# Patient Record
Sex: Male | Born: 1965 | Race: White | Hispanic: No | Marital: Married | Smoking: Former smoker
Health system: Southern US, Community
[De-identification: ages and names within clinical notes are randomized; demographics above are authoritative.]

## PROBLEM LIST (undated history)

## (undated) DIAGNOSIS — K219 Gastro-esophageal reflux disease without esophagitis: Secondary | ICD-10-CM

## (undated) HISTORY — PX: TONSILLECTOMY: SUR1361

---

## 2018-07-14 ENCOUNTER — Emergency Department (HOSPITAL_COMMUNITY)
Admission: EM | Admit: 2018-07-14 | Discharge: 2018-07-14 | Disposition: A | Discharge status: Home / Self Care | Payer: Self-pay | Attending: Emergency Medicine | Admitting: Emergency Medicine

## 2018-07-14 ENCOUNTER — Emergency Department (HOSPITAL_COMMUNITY): Payer: Self-pay

## 2018-07-14 ENCOUNTER — Encounter (HOSPITAL_COMMUNITY): Payer: Self-pay

## 2018-07-14 DIAGNOSIS — R1012 Left upper quadrant pain: Secondary | ICD-10-CM | POA: Insufficient documentation

## 2018-07-14 DIAGNOSIS — Y9289 Other specified places as the place of occurrence of the external cause: Secondary | ICD-10-CM | POA: Insufficient documentation

## 2018-07-14 DIAGNOSIS — S20212A Contusion of left front wall of thorax, initial encounter: Secondary | ICD-10-CM | POA: Insufficient documentation

## 2018-07-14 DIAGNOSIS — Y990 Civilian activity done for income or pay: Secondary | ICD-10-CM | POA: Insufficient documentation

## 2018-07-14 DIAGNOSIS — Z87891 Personal history of nicotine dependence: Secondary | ICD-10-CM | POA: Insufficient documentation

## 2018-07-14 DIAGNOSIS — S0990XA Unspecified injury of head, initial encounter: Secondary | ICD-10-CM | POA: Insufficient documentation

## 2018-07-14 DIAGNOSIS — Y9389 Activity, other specified: Secondary | ICD-10-CM | POA: Insufficient documentation

## 2018-07-14 DIAGNOSIS — W132XXA Fall from, out of or through roof, initial encounter: Secondary | ICD-10-CM | POA: Insufficient documentation

## 2018-07-14 LAB — CBC WITH DIFFERENTIAL/PLATELET
Abs Immature Granulocytes: 0.07 10*3/uL (ref 0.00–0.07)
Basophils Absolute: 0.1 10*3/uL (ref 0.0–0.1)
Basophils Relative: 1 %
Eosinophils Absolute: 0.1 10*3/uL (ref 0.0–0.5)
Eosinophils Relative: 0 %
HCT: 47.5 % (ref 39.0–52.0)
Hemoglobin: 15.2 g/dL (ref 13.0–17.0)
Immature Granulocytes: 1 %
Lymphocytes Relative: 15 %
Lymphs Abs: 1.8 10*3/uL (ref 0.7–4.0)
MCH: 30 pg (ref 26.0–34.0)
MCHC: 32 g/dL (ref 30.0–36.0)
MCV: 93.9 fL (ref 80.0–100.0)
Monocytes Absolute: 0.8 10*3/uL (ref 0.1–1.0)
Monocytes Relative: 7 %
NEUTROS PCT: 76 %
Neutro Abs: 9 10*3/uL — ABNORMAL HIGH (ref 1.7–7.7)
Platelets: 234 10*3/uL (ref 150–400)
RBC: 5.06 MIL/uL (ref 4.22–5.81)
RDW: 11.9 % (ref 11.5–15.5)
WBC: 11.8 10*3/uL — ABNORMAL HIGH (ref 4.0–10.5)
nRBC: 0 % (ref 0.0–0.2)

## 2018-07-14 LAB — BASIC METABOLIC PANEL
ANION GAP: 10 (ref 5–15)
BUN: 15 mg/dL (ref 6–20)
CO2: 24 mmol/L (ref 22–32)
Calcium: 9.1 mg/dL (ref 8.9–10.3)
Chloride: 106 mmol/L (ref 98–111)
Creatinine, Ser: 1.03 mg/dL (ref 0.61–1.24)
GFR calc Af Amer: >60 mL/min (ref >60)
GFR calc non Af Amer: >60 mL/min (ref >60)
Glucose, Bld: 95 mg/dL (ref 70–99)
Potassium: 4.5 mmol/L (ref 3.5–5.1)
Sodium: 140 mmol/L (ref 135–145)

## 2018-07-14 MED ORDER — SODIUM CHLORIDE 0.9 % IV BOLUS
1000.0000 mL | Freq: Once | INTRAVENOUS | Status: AC
  Administered 2018-07-14: 1000 mL via INTRAVENOUS

## 2018-07-14 MED ORDER — ONDANSETRON HCL 4 MG/2ML IJ SOLN
4.0000 mg | Freq: Once | INTRAMUSCULAR | Status: AC
  Administered 2018-07-14: 4 mg via INTRAVENOUS
  Filled 2018-07-14: qty 2

## 2018-07-14 MED ORDER — ORPHENADRINE CITRATE ER 100 MG PO TB12: 100.0000 mg | ORAL_TABLET | Freq: Two times a day (BID) | ORAL | 0 refills | Status: AC

## 2018-07-14 MED ORDER — MELOXICAM 7.5 MG PO TABS: 7.5000 mg | ORAL_TABLET | Freq: Every day | ORAL | 0 refills | Status: AC

## 2018-07-14 MED ORDER — IOHEXOL 300 MG/ML  SOLN
100.0000 mL | Freq: Once | INTRAMUSCULAR | Status: AC | PRN
  Administered 2018-07-14: 100 mL via INTRAVENOUS

## 2018-07-14 MED ORDER — HYDROMORPHONE HCL 1 MG/ML IJ SOLN
1.0000 mg | Freq: Once | INTRAMUSCULAR | Status: AC
  Administered 2018-07-14: 1 mg via INTRAVENOUS
  Filled 2018-07-14: qty 1

## 2018-07-14 NOTE — ED Provider Notes (Signed)
[facility] EMERGENCY DEPARTMENT Provider Note   CSN: [id number] Arrival date & time: 07/14/18  1614     History   Chief Complaint Chief Complaint  Patient presents with  . Fall    HPI Benjamin Wilson is a 53 y.o. male.  53 year old male presents with injuries from a fall.  Patient states that he was up on a metal roof today blowing leaves out of a gutter when he fell off of the roof, states he grabbed onto the gutter before falling to the ground landing in the dirt on his left side.  Patient states he had left side of his head with pain in his left shoulder and left ribs.  Patient states that he was helped back up after his fall and then felt like he saw bright white lights before he passed out.  Patient states witnesses on scene states that he was out for a few minutes before he came to and began to feel very nauseous but did not vomit.  Patient refused to have 911 called, drove his work truck home and then came to the emergency room to be seen.  Patient believes he dislocated his shoulder and that is now back into place, reports ongoing left-sided head tenderness, left side neck tenderness, left shoulder pain and rib pain and nausea.  Patient is ambulatory without assistance, no lower extremity injuries, no wounds or bleeding.  No other injuries, complaints, concerns.     History reviewed. No pertinent past medical history.  There are no active problems to display for this patient.   History reviewed. No pertinent surgical history.      Home Medications    Prior to Admission medications   Medication Sig Start Date End Date Taking? Authorizing Provider  meloxicam (MOBIC) 7.5 MG tablet Take 1 tablet (7.5 mg total) by mouth daily for 10 days. 07/14/18 07/24/18  Jeannie Fend, PA-C  orphenadrine (NORFLEX) 100 MG tablet Take 1 tablet (100 mg total) by mouth 2 (two) times daily for 7 days. 07/14/18 07/21/18  Jeannie Fend, PA-C    Family History History  reviewed. No pertinent family history.  Social History Social History   Tobacco Use  . Smoking status: Former Smoker    Last attempt to quit: 07/14/1993    Years since quitting: 25.0  Substance Use Topics  . Alcohol use: Never    Frequency: Never  . Drug use: Never     Allergies   Patient has no allergy information on record.   Review of Systems Review of Systems  Constitutional: Negative for fever.  Eyes: Negative for visual disturbance.  Respiratory: Positive for shortness of breath.   Cardiovascular: Positive for chest pain.  Gastrointestinal: Positive for abdominal pain and nausea. Negative for vomiting.  Genitourinary: Negative for hematuria.  Musculoskeletal: Positive for arthralgias, myalgias and neck pain. Negative for back pain, gait problem and joint swelling.  Skin: Negative for color change, rash and wound.  Allergic/Immunologic: Negative for immunocompromised state.  Neurological: Positive for syncope and headaches.  Hematological: Does not bruise/bleed easily.  Psychiatric/Behavioral: Negative for confusion.  All other systems reviewed and are negative.    Physical Exam Updated Vital Signs BP 129/78   Pulse 73   Temp 98.8 F (37.1 C) (Oral)   Resp 18   Ht 5\' 9"  (1.753 m)   Wt 95.3 kg   SpO2 97%   BMI 31.01 kg/m   Physical Exam Vitals signs and nursing note reviewed.  Constitutional:  General: He is not in acute distress.    Appearance: He is well-developed. He is not diaphoretic.  HENT:     Head: Normocephalic and atraumatic.     Nose: Nose normal.     Mouth/Throat:     Mouth: Mucous membranes are moist.  Eyes:     Extraocular Movements: Extraocular movements intact.     Pupils: Pupils are equal, round, and reactive to light.  Neck:     Musculoskeletal: Muscular tenderness present.  Cardiovascular:     Rate and Rhythm: Normal rate and regular rhythm.     Pulses: Normal pulses.     Heart sounds: Normal heart sounds.  Pulmonary:      Effort: Pulmonary effort is normal.     Breath sounds: Normal breath sounds.  Chest:     Chest wall: Tenderness present.    Abdominal:     Tenderness: There is abdominal tenderness in the left upper quadrant.  Musculoskeletal:        General: Tenderness present. No swelling or deformity.     Left shoulder: He exhibits decreased range of motion and tenderness. He exhibits no swelling, no effusion, no crepitus and normal pulse.     Cervical back: He exhibits no bony tenderness.     Thoracic back: He exhibits no bony tenderness.     Lumbar back: He exhibits no bony tenderness.       Back:  Skin:    General: Skin is warm and dry.     Findings: No abrasion, ecchymosis, erythema, rash or wound.  Neurological:     Mental Status: He is alert and oriented to person, place, and time.  Psychiatric:        Behavior: Behavior normal.      ED Treatments / Results  Labs (all labs ordered are listed, but only abnormal results are displayed) Labs Reviewed  CBC WITH DIFFERENTIAL/PLATELET - Abnormal; Notable for the following components:      Result Value   WBC 11.8 (*)    Neutro Abs 9.0 (*)    All other components within normal limits  BASIC METABOLIC PANEL  URINALYSIS, ROUTINE W REFLEX MICROSCOPIC    EKG None  Radiology Dg Chest 2 View  Result Date: 07/14/2018 CLINICAL DATA:  [id number] year old male status post slip and fall from the top of the house. Felt shoulder dislocate and relocate as he landed. Pain. EXAM: CHEST - 2 VIEW COMPARISON:  Left shoulder series today. FINDINGS: Normal lung volumes and mediastinal contours. Visualized tracheal air column is within normal limits. Both lungs appear clear. No pneumothorax or pleural effusion. Negative visible bowel gas pattern. No acute osseous abnormality identified. IMPRESSION: No acute cardiopulmonary abnormality or acute traumatic injury identified. Electronically Signed   By: Odessa FlemingH  Hall M.D.   On: 07/14/2018 18:18   Ct Head Wo  Contrast  Result Date: 07/14/2018 CLINICAL DATA:  Brain MRI and head CT 08/21/2003. Pain. EXAM: CT HEAD WITHOUT CONTRAST CT CERVICAL SPINE WITHOUT CONTRAST TECHNIQUE: Multidetector CT imaging of the head and cervical spine was performed following the standard protocol without intravenous contrast. Multiplanar CT image reconstructions of the cervical spine were also generated. COMPARISON:  None. FINDINGS: CT HEAD FINDINGS Brain: No midline shift, ventriculomegaly, mass effect, evidence of mass lesion, intracranial hemorrhage or evidence of cortically based acute infarction. Gray-white matter differentiation is within normal limits throughout the brain. Cerebral volume remains normal. Vascular: Mild Calcified atherosclerosis at the skull base. No suspicious intracranial vascular hyperdensity. Skull: Stable small benign appearing right frontal  bone soft tissue lesion on series 5, image 34, benign. No skull fracture. Sinuses/Orbits: Visualized paranasal sinuses and mastoids are stable and well pneumatized. Other: Negative orbits. Visualized scalp soft tissues are within normal limits. CT CERVICAL SPINE FINDINGS Alignment: Mild straightening of cervical lordosis. Cervicothoracic junction alignment is within normal limits. Bilateral posterior element alignment is within normal limits. Skull base and vertebrae: Visualized skull base is intact. No atlanto-occipital dissociation. No cervical spine fracture. Degenerative posterior nuchal calcification overlying the C6 spinous process. Soft tissues and spinal canal: No prevertebral fluid or swelling. No visible canal hematoma. Negative noncontrast neck soft tissues. Disc levels:  Cervical spine degeneration is mild for age. Upper chest: Visible upper thoracic levels appear intact. Negative lung apices. IMPRESSION: 1. No acute traumatic injury identified in the head or cervical spine. 2. Normal noncontrast CT appearance of the brain. Electronically Signed   By: Odessa Fleming M.D.    On: 07/14/2018 18:24   Ct Chest W Contrast  Result Date: 07/14/2018 CLINICAL DATA:  53 year old who fell from a ladder earlier today onto his LEFT side, complaining of progressively worsening LEFT-sided chest pain and abdominal pain. Initial encounter. EXAM: CT CHEST, ABDOMEN, AND PELVIS WITH CONTRAST TECHNIQUE: Multidetector CT imaging of the chest, abdomen and pelvis was performed following the standard protocol during bolus administration of intravenous contrast. CONTRAST:  OMNIPAQUE IOHEXOL 300 MG/ML IV. COMPARISON:  No prior chest CT. CT abdomen and pelvis 01/09/2015. FINDINGS: CT CHEST FINDINGS Cardiovascular: Normal heart size. No visible coronary atherosclerosis. No pericardial effusion. No evidence of thoracic aortic atherosclerosis or aneurysm. Proximal great vessels widely patent. No evidence of vascular injury. Mediastinum/Nodes: Residual thymic tissue in the anterior superior mediastinum. No evidence of mediastinal hematoma. No pathologically enlarged mediastinal, hilar or axillary lymph nodes. No mediastinal masses. Normal-appearing esophagus. Normal-appearing thyroid gland. Lungs/Pleura: Lung parenchyma clear. No evidence of pulmonary contusion. No confluent airspace consolidation. No evidence of interstitial lung disease. No pulmonary parenchymal nodules or masses. No pneumothorax. No pleural effusion/hemothorax. Central airways patent without significant bronchial wall thickening. Musculoskeletal: Regional skeleton unremarkable without acute or significant osseous abnormality. CT ABDOMEN PELVIS FINDINGS Hepatobiliary: Mild diffuse hepatic steatosis. Faint low-attenuation lesion in the POSTERIOR segment RIGHT lobe is unchanged since the prior CT and is inconspicuous on the delayed images, therefore likely a benign hemangioma. No new or significant focal hepatic parenchymal abnormalities. Gallbladder normal in appearance without calcified gallstones. No biliary ductal dilation. Pancreas:  Normal in appearance without evidence of mass, ductal dilation, or inflammation. Spleen: Normal in size and appearance. Adrenals/Urinary Tract: Normal appearing adrenal glands. Kidneys normal in size and appearance without focal parenchymal abnormality. No hydronephrosis. No evidence of urinary tract calculi. Normal appearing urinary bladder. Stomach/Bowel: Stomach normal in appearance for the degree of distention. Normal-appearing small bowel. Normal-appearing colon with expected stool burden. Normal appendix in the RIGHT UPPER pelvis. Vascular/Lymphatic: No visible aortoiliofemoral atherosclerosis. Widely patent visceral arteries. Normal-appearing portal venous and systemic venous systems. No pathologic lymphadenopathy. Reproductive: Prostate gland and seminal vesicles normal in size and appearance for age. Other: No evidence of free intraperitoneal fluid or blood. No evidence of retroperitoneal hematoma. No evidence of mesenteric injury. Musculoskeletal: Degenerative disc disease at L5-S1 with a central protrusion. Acute angulation of the coccyx without adjacent hemorrhage or hematoma to indicate acuity. No fractures identified involving the lumbar spine or pelvis. IMPRESSION: 1. No evidence of acute traumatic injury to the thorax, abdomen or pelvis. 2.  No acute cardiopulmonary disease. 3. Acute angulation of the coccyx without adjacent hemorrhage or  hematoma to indicate acuity. Please correlate with point tenderness. No acute osseous abnormalities elsewhere. 4. Mild diffuse hepatic steatosis. 5. Stable benign hemangioma involving the RIGHT lobe of the liver. Electronically Signed   By: Hulan Saas M.D.   On: 07/14/2018 19:54   Ct Cervical Spine Wo Contrast  Result Date: 07/14/2018 CLINICAL DATA:  Brain MRI and head CT 08/21/2003. Pain. EXAM: CT HEAD WITHOUT CONTRAST CT CERVICAL SPINE WITHOUT CONTRAST TECHNIQUE: Multidetector CT imaging of the head and cervical spine was performed following the standard  protocol without intravenous contrast. Multiplanar CT image reconstructions of the cervical spine were also generated. COMPARISON:  None. FINDINGS: CT HEAD FINDINGS Brain: No midline shift, ventriculomegaly, mass effect, evidence of mass lesion, intracranial hemorrhage or evidence of cortically based acute infarction. Gray-white matter differentiation is within normal limits throughout the brain. Cerebral volume remains normal. Vascular: Mild Calcified atherosclerosis at the skull base. No suspicious intracranial vascular hyperdensity. Skull: Stable small benign appearing right frontal bone soft tissue lesion on series 5, image 34, benign. No skull fracture. Sinuses/Orbits: Visualized paranasal sinuses and mastoids are stable and well pneumatized. Other: Negative orbits. Visualized scalp soft tissues are within normal limits. CT CERVICAL SPINE FINDINGS Alignment: Mild straightening of cervical lordosis. Cervicothoracic junction alignment is within normal limits. Bilateral posterior element alignment is within normal limits. Skull base and vertebrae: Visualized skull base is intact. No atlanto-occipital dissociation. No cervical spine fracture. Degenerative posterior nuchal calcification overlying the C6 spinous process. Soft tissues and spinal canal: No prevertebral fluid or swelling. No visible canal hematoma. Negative noncontrast neck soft tissues. Disc levels:  Cervical spine degeneration is mild for age. Upper chest: Visible upper thoracic levels appear intact. Negative lung apices. IMPRESSION: 1. No acute traumatic injury identified in the head or cervical spine. 2. Normal noncontrast CT appearance of the brain. Electronically Signed   By: Odessa Fleming M.D.   On: 07/14/2018 18:24   Ct Abdomen Pelvis W Contrast  Result Date: 07/14/2018 CLINICAL DATA:  53 year old who fell from a ladder earlier today onto his LEFT side, complaining of progressively worsening LEFT-sided chest pain and abdominal pain. Initial  encounter. EXAM: CT CHEST, ABDOMEN, AND PELVIS WITH CONTRAST TECHNIQUE: Multidetector CT imaging of the chest, abdomen and pelvis was performed following the standard protocol during bolus administration of intravenous contrast. CONTRAST:  OMNIPAQUE IOHEXOL 300 MG/ML IV. COMPARISON:  No prior chest CT. CT abdomen and pelvis 01/09/2015. FINDINGS: CT CHEST FINDINGS Cardiovascular: Normal heart size. No visible coronary atherosclerosis. No pericardial effusion. No evidence of thoracic aortic atherosclerosis or aneurysm. Proximal great vessels widely patent. No evidence of vascular injury. Mediastinum/Nodes: Residual thymic tissue in the anterior superior mediastinum. No evidence of mediastinal hematoma. No pathologically enlarged mediastinal, hilar or axillary lymph nodes. No mediastinal masses. Normal-appearing esophagus. Normal-appearing thyroid gland. Lungs/Pleura: Lung parenchyma clear. No evidence of pulmonary contusion. No confluent airspace consolidation. No evidence of interstitial lung disease. No pulmonary parenchymal nodules or masses. No pneumothorax. No pleural effusion/hemothorax. Central airways patent without significant bronchial wall thickening. Musculoskeletal: Regional skeleton unremarkable without acute or significant osseous abnormality. CT ABDOMEN PELVIS FINDINGS Hepatobiliary: Mild diffuse hepatic steatosis. Faint low-attenuation lesion in the POSTERIOR segment RIGHT lobe is unchanged since the prior CT and is inconspicuous on the delayed images, therefore likely a benign hemangioma. No new or significant focal hepatic parenchymal abnormalities. Gallbladder normal in appearance without calcified gallstones. No biliary ductal dilation. Pancreas: Normal in appearance without evidence of mass, ductal dilation, or inflammation. Spleen: Normal in size and appearance.  Adrenals/Urinary Tract: Normal appearing adrenal glands. Kidneys normal in size and appearance without focal parenchymal  abnormality. No hydronephrosis. No evidence of urinary tract calculi. Normal appearing urinary bladder. Stomach/Bowel: Stomach normal in appearance for the degree of distention. Normal-appearing small bowel. Normal-appearing colon with expected stool burden. Normal appendix in the RIGHT UPPER pelvis. Vascular/Lymphatic: No visible aortoiliofemoral atherosclerosis. Widely patent visceral arteries. Normal-appearing portal venous and systemic venous systems. No pathologic lymphadenopathy. Reproductive: Prostate gland and seminal vesicles normal in size and appearance for age. Other: No evidence of free intraperitoneal fluid or blood. No evidence of retroperitoneal hematoma. No evidence of mesenteric injury. Musculoskeletal: Degenerative disc disease at L5-S1 with a central protrusion. Acute angulation of the coccyx without adjacent hemorrhage or hematoma to indicate acuity. No fractures identified involving the lumbar spine or pelvis. IMPRESSION: 1. No evidence of acute traumatic injury to the thorax, abdomen or pelvis. 2.  No acute cardiopulmonary disease. 3. Acute angulation of the coccyx without adjacent hemorrhage or hematoma to indicate acuity. Please correlate with point tenderness. No acute osseous abnormalities elsewhere. 4. Mild diffuse hepatic steatosis. 5. Stable benign hemangioma involving the RIGHT lobe of the liver. Electronically Signed   By: Hulan Saas M.D.   On: 07/14/2018 19:54   Dg Shoulder Left  Result Date: 07/14/2018 CLINICAL DATA:  53 year old male status post slip and fall from the top of the house. Felt shoulder dislocate and relocate as he landed. Pain. EXAM: LEFT SHOULDER - 2+ VIEW COMPARISON:  None. FINDINGS: No glenohumeral joint dislocation. The proximal left humerus appears intact. No scapula fracture is identified. The left clavicle appears intact. Visible left ribs and lung parenchyma appear negative. IMPRESSION: No acute fracture or dislocation identified about the left  shoulder. Electronically Signed   By: Odessa Fleming M.D.   On: 07/14/2018 18:17    Procedures Procedures (including critical care time)  Medications Ordered in ED Medications  sodium chloride 0.9 % bolus 1,000 mL (1,000 mLs Intravenous New Bag/Given 07/14/18 1808)  HYDROmorphone (DILAUDID) injection 1 mg (1 mg Intravenous Given 07/14/18 1808)  ondansetron (ZOFRAN) injection 4 mg (4 mg Intravenous Given 07/14/18 1808)  iohexol (OMNIPAQUE) 300 MG/ML solution 100 mL (100 mLs Intravenous Contrast Given 07/14/18 1923)     Initial Impression / Assessment and Plan / ED Course  I have reviewed the triage vital signs and the nursing notes.  Pertinent labs & imaging results that were available during my care of the patient were reviewed by me and considered in my medical decision making (see chart for details).  Clinical Course as of Jul 14 2010  Thu Jul 14, 2018  302 53 year old male presents for evaluation after a fall of approximately 16 feet from a roof.  Patient was evaluated on scene by paramedics, refused transport and drove himself home prior to coming to the ER.  Patient reports falling on his left side hitting his head, no LOC with the initial fall however after standing states he saw a bright light and then passed out for a few minutes.  Patient was assisted to the ground by friends, upon waking had severe nausea without vomiting.  On exam patient has tenderness of his left shoulder, left ribs, left upper abdomen.  There is no ecchymosis, abrasions, crepitus.  CT head, C-spine, chest abdomen pelvis without acute injury.  Discussed results and plan of care with patient and family, recommend home to rest and follow-up with PCP in the next day or 2, advised to avoid driving while taking Norflex, given prescription  for meloxicam for pain.  Patient advised return to ER for any new or worsening symptoms.   [LM]    Clinical Course User Index [LM] Jeannie FendMurphy, Bless Belshe A, PA-C   Final Clinical Impressions(s) /  ED Diagnoses   Final diagnoses:  Fall from roof, initial encounter  Contusion of left chest wall, initial encounter  Closed head injury, initial encounter    ED Discharge Orders         Ordered    orphenadrine (NORFLEX) 100 MG tablet  2 times daily     07/14/18 2008    meloxicam (MOBIC) 7.5 MG tablet  Daily     07/14/18 2008           Alden HippMurphy, Keenon Leitzel A, PA-C 07/14/18 2012    Cathren LaineSteinl, Kevin, MD 07/21/18 1413

## 2018-07-14 NOTE — Discharge Instructions (Addendum)
Follow-up with your primary care provider for recheck in the next 1 to 2 days.  Return to the ER for any new or worsening symptoms. Take meloxicam as needed as prescribed for pain.  Take Norflex as needed as prescribed for muscle spasm, do not drive or operate machinery while taking Norflex. Recommend you go home to rest for the next day or 2 until repeat evaluation with your primary care provider for closed head injury.

## 2018-07-14 NOTE — ED Triage Notes (Signed)
Pt slipped and fell off the top of a house 16 feet due to slick surface. Pt fell onto his left side and hit head. Not on blood thinners. Pt complains of pain to left shoulder, left ribs and chest. VSS. Able to move all extremities.

## 2018-07-14 NOTE — ED Notes (Signed)
Patient verbalizes understanding of discharge instructions. Opportunity for questioning and answers were provided. Armband removed by staff, pt discharged from ED ambulatory.   

## 2018-07-14 NOTE — ED Notes (Signed)
Patient transported to CT 

## 2021-07-27 ENCOUNTER — Emergency Department (HOSPITAL_BASED_OUTPATIENT_CLINIC_OR_DEPARTMENT_OTHER): Payer: Self-pay

## 2021-07-27 ENCOUNTER — Other Ambulatory Visit: Payer: Self-pay

## 2021-07-27 ENCOUNTER — Encounter (HOSPITAL_BASED_OUTPATIENT_CLINIC_OR_DEPARTMENT_OTHER): Payer: Self-pay | Admitting: *Deleted

## 2021-07-27 ENCOUNTER — Emergency Department (HOSPITAL_BASED_OUTPATIENT_CLINIC_OR_DEPARTMENT_OTHER)
Admission: EM | Admit: 2021-07-27 | Discharge: 2021-07-27 | Disposition: A | Discharge status: Home / Self Care | Payer: Self-pay | Attending: Emergency Medicine | Admitting: Emergency Medicine

## 2021-07-27 DIAGNOSIS — D72829 Elevated white blood cell count, unspecified: Secondary | ICD-10-CM | POA: Insufficient documentation

## 2021-07-27 DIAGNOSIS — R7309 Other abnormal glucose: Secondary | ICD-10-CM | POA: Insufficient documentation

## 2021-07-27 DIAGNOSIS — L03116 Cellulitis of left lower limb: Secondary | ICD-10-CM | POA: Insufficient documentation

## 2021-07-27 HISTORY — DX: Gastro-esophageal reflux disease without esophagitis: K21.9

## 2021-07-27 LAB — URINALYSIS, ROUTINE W REFLEX MICROSCOPIC
Bilirubin Urine: NEGATIVE
Glucose, UA: NEGATIVE mg/dL
Hgb urine dipstick: NEGATIVE
Ketones, ur: NEGATIVE mg/dL
Leukocytes,Ua: NEGATIVE
Nitrite: NEGATIVE
Protein, ur: NEGATIVE mg/dL
Specific Gravity, Urine: 1.025 (ref 1.005–1.030)
pH: 5.5 (ref 5.0–8.0)

## 2021-07-27 LAB — COMPREHENSIVE METABOLIC PANEL
ALT: 38 U/L (ref 0–44)
AST: 22 U/L (ref 15–41)
Albumin: 3.7 g/dL (ref 3.5–5.0)
Alkaline Phosphatase: 64 U/L (ref 38–126)
Anion gap: 11 (ref 5–15)
BUN: 27 mg/dL — ABNORMAL HIGH (ref 6–20)
CO2: 24 mmol/L (ref 22–32)
Calcium: 9.4 mg/dL (ref 8.9–10.3)
Chloride: 101 mmol/L (ref 98–111)
Creatinine, Ser: 0.9 mg/dL (ref 0.61–1.24)
GFR, Estimated: >60 mL/min (ref >60)
Glucose, Bld: 135 mg/dL — ABNORMAL HIGH (ref 70–99)
Potassium: 4.8 mmol/L (ref 3.5–5.1)
Sodium: 136 mmol/L (ref 135–145)
Total Bilirubin: 0.7 mg/dL (ref 0.3–1.2)
Total Protein: 7.5 g/dL (ref 6.5–8.1)

## 2021-07-27 LAB — CBC WITH DIFFERENTIAL/PLATELET
Abs Immature Granulocytes: 1.07 10*3/uL — ABNORMAL HIGH (ref 0.00–0.07)
Basophils Absolute: 0 10*3/uL (ref 0.0–0.1)
Basophils Relative: 0 %
Eosinophils Absolute: 0 10*3/uL (ref 0.0–0.5)
Eosinophils Relative: 0 %
HCT: 46.5 % (ref 39.0–52.0)
Hemoglobin: 15.7 g/dL (ref 13.0–17.0)
Immature Granulocytes: 7 %
Lymphocytes Relative: 11 %
Lymphs Abs: 1.7 10*3/uL (ref 0.7–4.0)
MCH: 31.5 pg (ref 26.0–34.0)
MCHC: 33.8 g/dL (ref 30.0–36.0)
MCV: 93.2 fL (ref 80.0–100.0)
Monocytes Absolute: 0.6 10*3/uL (ref 0.1–1.0)
Monocytes Relative: 4 %
Neutro Abs: 12.2 10*3/uL — ABNORMAL HIGH (ref 1.7–7.7)
Neutrophils Relative %: 78 %
Platelets: 328 10*3/uL (ref 150–400)
RBC: 4.99 MIL/uL (ref 4.22–5.81)
RDW: 12.6 % (ref 11.5–15.5)
WBC: 15.5 10*3/uL — ABNORMAL HIGH (ref 4.0–10.5)
nRBC: 0.3 % — ABNORMAL HIGH (ref 0.0–0.2)

## 2021-07-27 MED ORDER — CEPHALEXIN 500 MG PO CAPS: 500.0000 mg | ORAL_CAPSULE | Freq: Two times a day (BID) | ORAL | 0 refills | Status: AC

## 2021-07-27 MED ORDER — HYDROCODONE-ACETAMINOPHEN 5-325 MG PO TABS
1.0000 | ORAL_TABLET | Freq: Once | ORAL | Status: AC
  Administered 2021-07-27: 1 via ORAL
  Filled 2021-07-27: qty 1

## 2021-07-27 MED ORDER — HYDROCODONE-ACETAMINOPHEN 5-325 MG PO TABS: 1.0000 | ORAL_TABLET | Freq: Four times a day (QID) | ORAL | 0 refills | Status: AC | PRN

## 2021-07-27 MED ORDER — CEPHALEXIN 250 MG PO CAPS
500.0000 mg | ORAL_CAPSULE | Freq: Once | ORAL | Status: AC
  Administered 2021-07-27: 500 mg via ORAL
  Filled 2021-07-27: qty 2

## 2021-07-27 NOTE — ED Notes (Signed)
Xrays tbd s/p ultrasound

## 2021-07-27 NOTE — ED Provider Notes (Signed)
[facility] Provider Note   CSN: [id number] Arrival date & time: 07/27/21  1640    History  Chief Complaint  Patient presents with   Foot Swelling    Elean Mittelman is a 56 y.o. male here for evaluation of left foot swelling and pain x 4 days. Denies known injury.  States he had a "ball" on the top of his foot earlier this week states swelling has improved however still has significant pain, swelling, redness to dorsal aspect foot.  Also admits to some right knee pain which will occasionally shoot down his leg.  No posterior calf pain.  No chest pain, shortness of breath, history of PE or DVT.  States he had a fever up to 102 earlier in the week.  No congestion, rhinorrhea, abdominal pain, diarrhea, dysuria.  No PND, orthopnea, history of CHF.  Today he had polyuria last night, got up 6 times to urinate.  However patient states he did drink "lots" of cherry juice, cranberry juice and pineapple juice to help with inflammation.  No documented history of diabetes.  HPI     Home Medications Prior to Admission medications   Medication Sig Start Date End Date Taking? Authorizing Provider  cephALEXin (KEFLEX) 500 MG capsule Take 1 capsule (500 mg total) by mouth 2 (two) times daily for 7 days. 07/27/21 08/03/21 Yes Reverie Vaquera A, PA-C  HYDROcodone-acetaminophen (NORCO/VICODIN) 5-325 MG tablet Take 1-2 tablets by mouth every 6 (six) hours as needed. 07/27/21  Yes Preslyn Warr A, PA-C      Allergies    Hornet venom    Review of Systems   Review of Systems  Constitutional:  Positive for chills, fatigue and fever.  HENT: Negative.    Respiratory: Negative.    Cardiovascular: Negative.   Gastrointestinal: Negative.  Abdominal distention: last night.  Endocrine: Positive for polyuria. Negative for cold intolerance, heat intolerance, polydipsia and polyphagia.  Genitourinary:  Positive for frequency. Negative for decreased urine volume, difficulty urinating,  dysuria and urgency.  Musculoskeletal:        Left foot and knee pain  Neurological: Negative.   All other systems reviewed and are negative.  Physical Exam Updated Vital Signs BP (!) 142/94 (BP Location: Left Arm)    Pulse 66    Temp 98.2 F (36.8 C) (Oral)    Resp 18    Ht 5\' 9"  (1.753 m)    Wt 99.8 kg    SpO2 98%    BMI 32.49 kg/m  Physical Exam Vitals and nursing note reviewed.  Constitutional:      General: He is not in acute distress.    Appearance: He is well-developed. He is not ill-appearing, toxic-appearing or diaphoretic.  HENT:     Head: Normocephalic and atraumatic.     Nose: Nose normal.     Mouth/Throat:     Mouth: Mucous membranes are moist.  Eyes:     Pupils: Pupils are equal, round, and reactive to light.  Cardiovascular:     Rate and Rhythm: Normal rate and regular rhythm.     Pulses: Normal pulses.          Radial pulses are 2+ on the right side and 2+ on the left side.       Dorsalis pedis pulses are 2+ on the right side and 2+ on the left side.       Posterior tibial pulses are 2+ on the right side and 2+ on the left side.  Heart sounds: Normal heart sounds.  Pulmonary:     Effort: Pulmonary effort is normal. No respiratory distress.     Breath sounds: Normal breath sounds and air entry.     Comments: Clear bil, speaks in full sentences without difficulty Chest:     Comments: Non tender Abdominal:     General: Bowel sounds are normal. There is no distension.     Palpations: Abdomen is soft.     Tenderness: There is no abdominal tenderness. There is no right CVA tenderness, left CVA tenderness, guarding or rebound.     Hernia: No hernia is present.     Comments: Non tender without distention  Musculoskeletal:        General: Normal range of motion.     Cervical back: Normal range of motion and neck supple.     Comments: Full range of motion bilateral lower extremities however pain to dorsal aspect left foot with some overlying erythema, mild  warmth.  Possible minimal soft tissue swelling.  Able to plantarflex, dorsiflex, wiggle toes.  Skin:    General: Skin is warm and dry.     Capillary Refill: Capillary refill takes less than 2 seconds.     Comments: Mild soft tissue swelling dorsum of left foot, some surrounding erythema, warmth, no fluctuance, induration, demarcated erythema or streaking  Neurological:     General: No focal deficit present.     Mental Status: He is alert and oriented to person, place, and time.    ED Results / Procedures / Treatments   Labs (all labs ordered are listed, but only abnormal results are displayed) Labs Reviewed  CBC WITH DIFFERENTIAL/PLATELET - Abnormal; Notable for the following components:      Result Value   WBC 15.5 (*)    nRBC 0.3 (*)    Neutro Abs 12.2 (*)    Abs Immature Granulocytes 1.07 (*)    All other components within normal limits  COMPREHENSIVE METABOLIC PANEL - Abnormal; Notable for the following components:   Glucose, Bld 135 (*)    BUN 27 (*)    All other components within normal limits  URINALYSIS, ROUTINE W REFLEX MICROSCOPIC    EKG None  Radiology US Venous Img Lower Unilateral Left  Result Date: 07/27/2021 CLINICAL DATA:  Left lower extremity pain and swelling EXAM: LEFT LOWER EXTREMITY VENOUS DOPPLER ULTRASOUND TECHNIQUE: Gray-scale sonography with graded compression, as well as color Doppler and duplex ultrasound were performed to evaluate the lower extremity deep venous systems from the level of the common femoral vein and including the common femoral, femoral, profunda femoral, popliteal and calf veins including the posterior tibial, peroneal and gastrocnemius veins when visible. The superficial great saphenous vein was also interrogated. Spectral Doppler was utilized to evaluate flow at rest and with distal augmentation maneuvers in the common femoral, femoral and popliteal veins. COMPARISON:  None. FINDINGS: Contralateral Common Femoral Vein: Respiratory  phasicity is normal and symmetric with the symptomatic side. No evidence of thrombus. Normal compressibility. Common Femoral Vein: No evidence of thrombus. Normal compressibility, respiratory phasicity and response to augmentation. Saphenofemoral Junction: No evidence of thrombus. Normal compressibility and flow on color Doppler imaging. Profunda Femoral Vein: No evidence of thrombus. Normal compressibility and flow on color Doppler imaging. Femoral Vein: No evidence of thrombus. Normal compressibility, respiratory phasicity and response to augmentation. Popliteal Vein: No evidence of thrombus. Normal compressibility, respiratory phasicity and response to augmentation. Calf Veins: No evidence of thrombus. Normal compressibility and flow on color Doppler imaging. Superficial  Great Saphenous Vein: No evidence of thrombus. Normal compressibility. Venous Reflux:  None. Other Findings:  None. IMPRESSION: No evidence of deep venous thrombosis. Electronically Signed   By: Inez Catalina M.D.   On: 07/27/2021 20:19   DG Knee Complete 4 Views Left  Result Date: 07/27/2021 CLINICAL DATA:  Left knee pain several days, initial encounter EXAM: LEFT KNEE - COMPLETE 4+ VIEW COMPARISON:  None. FINDINGS: Minimal medial joint space narrowing is noted. No acute fracture or dislocation is noted. No joint effusion is seen. IMPRESSION: Minimal degenerative change without acute bony abnormality. Electronically Signed   By: Inez Catalina M.D.   On: 07/27/2021 20:12   DG Foot Complete Left  Result Date: 07/27/2021 CLINICAL DATA:  Left foot swelling for several days, initial encounter EXAM: LEFT FOOT - COMPLETE 3+ VIEW COMPARISON:  None. FINDINGS: There is no evidence of fracture or dislocation. There is no evidence of arthropathy or other focal bone abnormality. Soft tissues are unremarkable. IMPRESSION: No acute abnormality noted. Electronically Signed   By: Inez Catalina M.D.   On: 07/27/2021 20:12    Procedures Procedures     Medications Ordered in ED Medications  HYDROcodone-acetaminophen (NORCO/VICODIN) 5-325 MG per tablet 1 tablet (1 tablet Oral Given 07/27/21 2129)  cephALEXin (KEFLEX) capsule 500 mg (500 mg Oral Given 07/27/21 2129)    ED Course/ Medical Decision Making/ A&P    56 year old here for evaluation multiple complaints.  Noted some swelling and pain to left foot earlier in the week currently placed on prednisone by PCP with concern for possible gout.  Swelling improved or he still having some pain.  He is neurovascularly intact.  Diffuse tenderness to dorsum of left foot with some surrounding erythema however no streaking, demarcated erythema.  Does admit to a fever up to 102 at initial onset of symptoms.  His compartments are soft.  Patient admits to some left streaking knee pain over his full range of motion to this area.  Also admits to urinary urgency last night which he relates to drinking moderate amount of juice to help with his inflammation.  Has prior history of diabetes.  No symmetrical lower extremity edema to suggest CHF.,  Denies PND, orthopnea.  Denies chest pain, shortness of breath to suggest PE.  His heart and lungs are clear.  Abdomen soft, nontender, does not appear grossly fluid overloaded.  We will plan on labs, imaging and reassess.  Labs and imaging personally reviewed and interpreted:  CBC with leukocytosis at 15.5, suspect due to patient's recent prednisone use? Metabolic panel glucose [id number], BUN 27 UA negative for infection X-ray left foot, left knee without significant normality Ultrasound negative for DVT  Reassessed patient.  Suspect his polyuria last night likely due to increase p.o. fluids.  Suggest cutting back on PO fluids at night to decrease his nocturia.  Hospital course.  Pain, given some soft tissue swelling, mild erythema, warmth, reassuring x-ray, ultrasound we will treat for cellulitis.  We will follow-up outpatient with PCP.  He is agreeable  At this time I  have low suspicion for acute VTE, ischemia, occult fracture, dislocation, gas-forming organism,myositis, septic joint, hematoma, abscess  The patient has been appropriately medically screened and/or stabilized in the ED. I have low suspicion for any other emergent medical condition which would require further screening, evaluation or treatment in the ED or require inpatient management.  Patient is hemodynamically stable and in no acute distress.  Patient able to ambulate in department prior to ED.  Evaluation does  not show acute pathology that would require ongoing or additional emergent interventions while in the emergency department or further inpatient treatment.  I have discussed the diagnosis with the patient and answered all questions.  Pain is been managed while in the emergency department and patient has no further complaints prior to discharge.  Patient is comfortable with plan discussed in room and is stable for discharge at this time.  I have discussed strict return precautions for returning to the emergency department.  Patient was encouraged to follow-up with PCP/specialist refer to at discharge.                           Medical Decision Making Amount and/or Complexity of Data Reviewed Independent Historian: spouse External Data Reviewed: labs and notes. Labs: ordered. Decision-making details documented in ED Course. Radiology: ordered and independent interpretation performed. Decision-making details documented in ED Course. ECG/medicine tests: ordered.  Risk OTC drugs. Prescription drug management. Parenteral controlled substances. Risk Details: Do not feel patient needs any additional labs, imaging or hospitalization at this time           Final Clinical Impression(s) / ED Diagnoses Final diagnoses:  Cellulitis of left lower extremity    Rx / DC Orders ED Discharge Orders          Ordered    HYDROcodone-acetaminophen (NORCO/VICODIN) 5-325 MG tablet  Every 6  hours PRN        07/27/21 2130    cephALEXin (KEFLEX) 500 MG capsule  2 times daily        07/27/21 2130              Jammie Clink A, PA-C 07/27/21 2134    Lajean Saver, MD 07/28/21 1507

## 2021-07-27 NOTE — Discharge Instructions (Addendum)
Take medications as prescribed.  Please use caution as the Percocet is a controlled substance.  This may become addictive.  Do not drive or operate heavy machinery while taking this medication.  Return for any worsening symptoms.  Follow-up with primary care provider early next week for reevaluation

## 2021-07-27 NOTE — ED Triage Notes (Addendum)
Left foot swollen since Wednesday. Denies injury. States approx a month ago he had pain and swelling in the same leg that started in his groin and he was prescribed prednisone. Reports he had some dark spots on his leg after this. States had fever 102 approx 1-2 weeks. States he is still taking prednisone

## 2023-08-31 IMAGING — DX DG FOOT COMPLETE 3+V*L*
3 series · 3 of 3 positions shown · non-contrast
Comparison: None.

CLINICAL DATA: Left foot swelling for several days, initial
encounter

EXAM:
LEFT FOOT - COMPLETE 3+ VIEW

[foot ap]
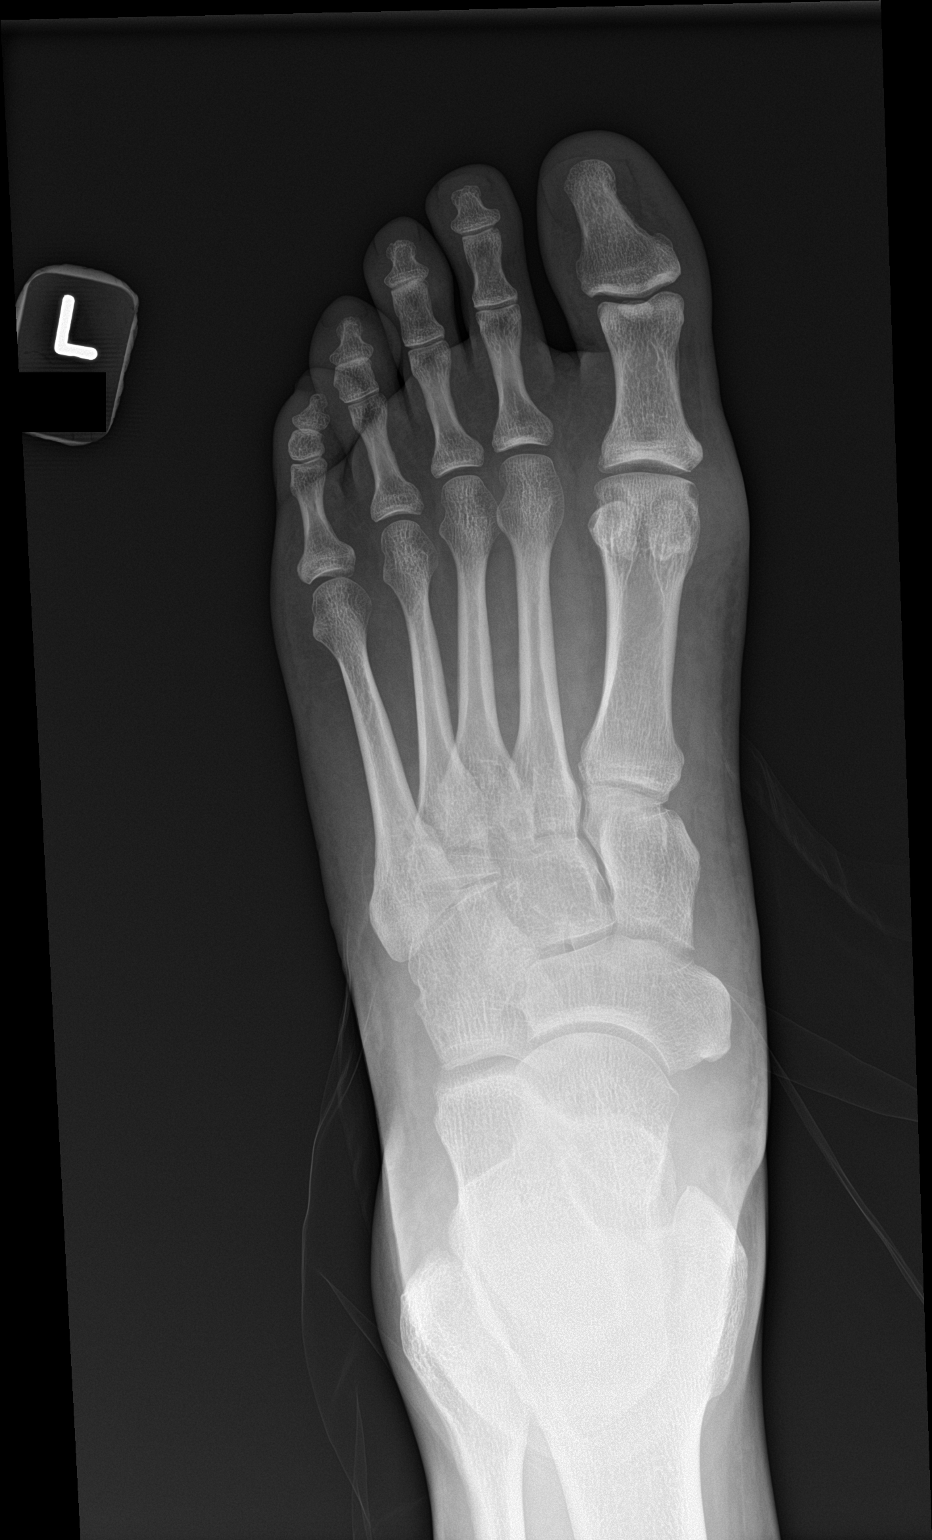

[foot obl]
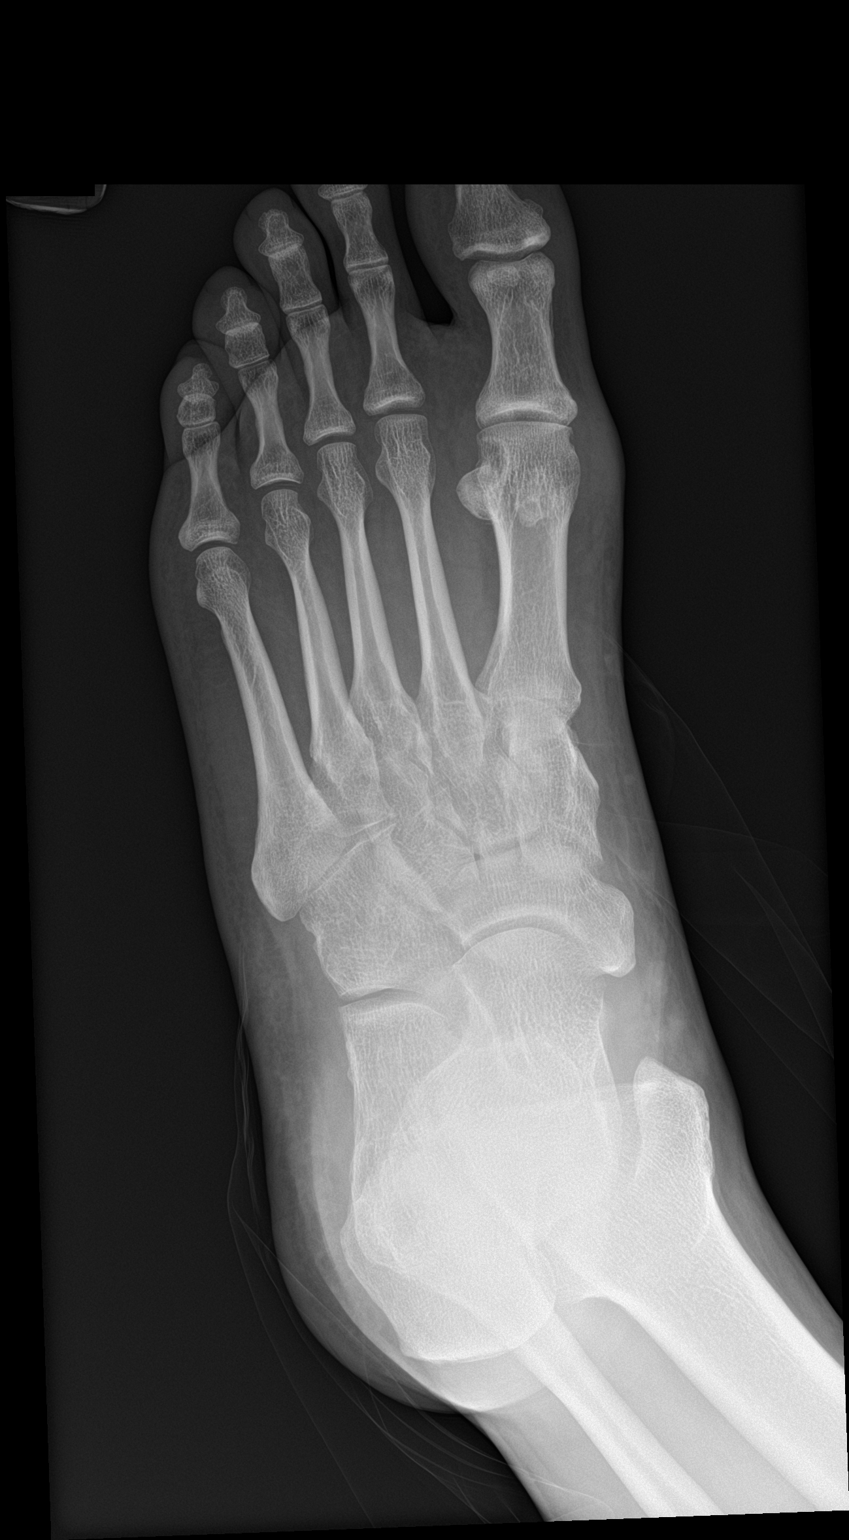

[foot lat]
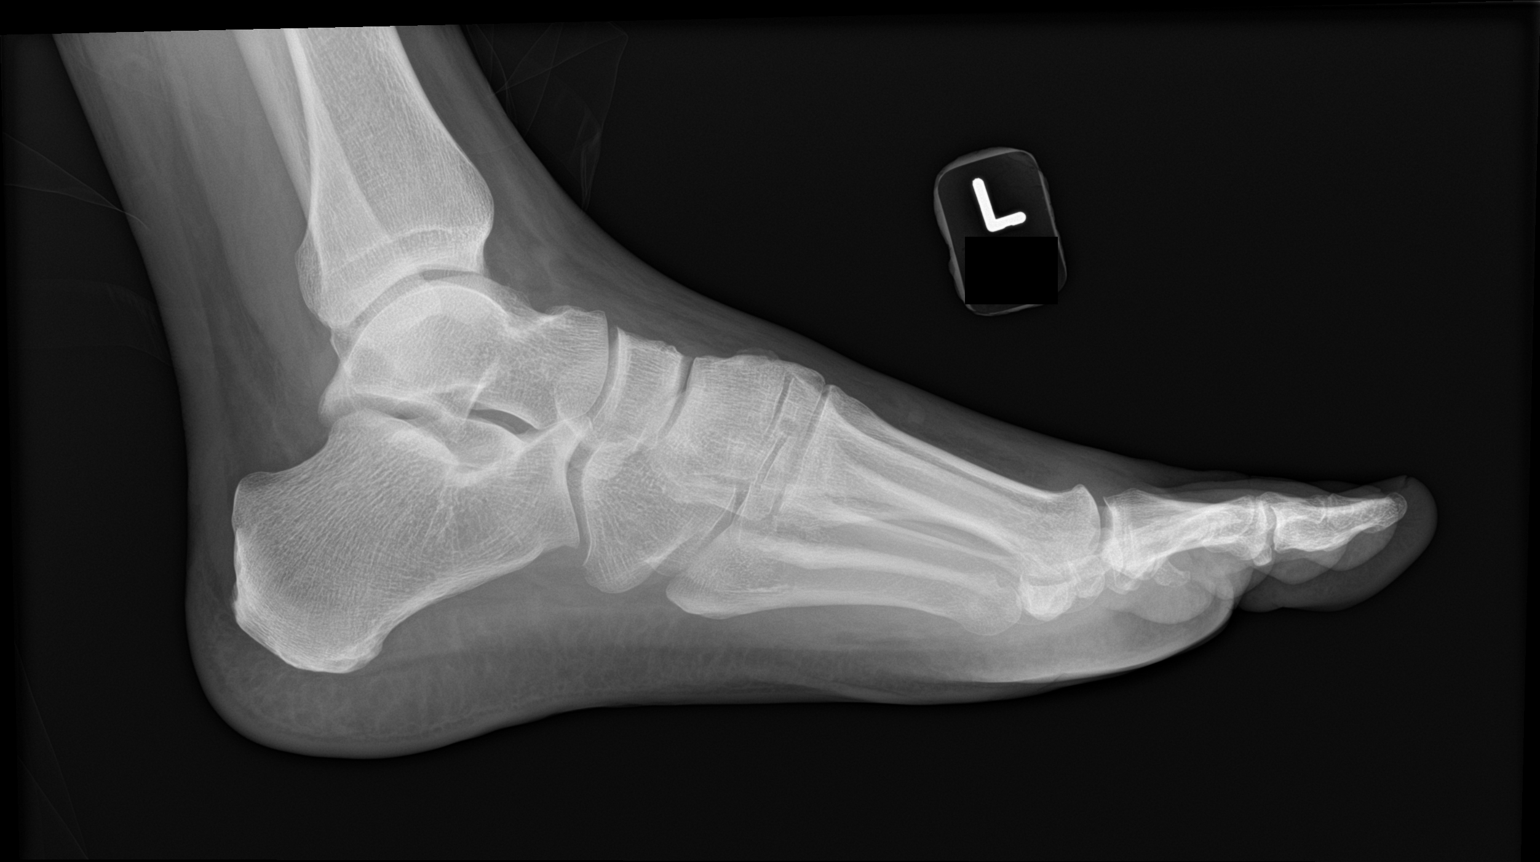

[3 of 3 positions shown; findings below may reference images not displayed]

FINDINGS: There is no evidence of fracture or dislocation. There is no
evidence of arthropathy or other focal bone abnormality. Soft
tissues are unremarkable.
IMPRESSION: No acute abnormality noted.
